# Patient Record
Sex: Male | Born: 1940 | Race: White | Hispanic: No | Marital: Married | Smoking: Current every day smoker
Health system: Southern US, Community
[De-identification: ages and names within clinical notes are randomized; demographics above are authoritative.]

## PROBLEM LIST (undated history)

## (undated) DIAGNOSIS — I25119 Atherosclerotic heart disease of native coronary artery with unspecified angina pectoris: Secondary | ICD-10-CM

## (undated) DIAGNOSIS — M545 Low back pain, unspecified: Secondary | ICD-10-CM

## (undated) DIAGNOSIS — C61 Malignant neoplasm of prostate: Secondary | ICD-10-CM

## (undated) DIAGNOSIS — C4492 Squamous cell carcinoma of skin, unspecified: Secondary | ICD-10-CM

## (undated) DIAGNOSIS — E039 Hypothyroidism, unspecified: Secondary | ICD-10-CM

## (undated) DIAGNOSIS — E785 Hyperlipidemia, unspecified: Secondary | ICD-10-CM

## (undated) DIAGNOSIS — F331 Major depressive disorder, recurrent, moderate: Secondary | ICD-10-CM

## (undated) DIAGNOSIS — D075 Carcinoma in situ of prostate: Secondary | ICD-10-CM

## (undated) HISTORY — DX: Hyperlipidemia, unspecified: E78.5

## (undated) HISTORY — DX: Squamous cell carcinoma of skin, unspecified: C44.92

## (undated) HISTORY — DX: Low back pain, unspecified: M54.50

## (undated) HISTORY — DX: Malignant neoplasm of prostate: C61

## (undated) HISTORY — DX: Hypothyroidism, unspecified: E03.9

## (undated) HISTORY — DX: Low back pain: M54.5

## (undated) HISTORY — DX: Major depressive disorder, recurrent, moderate: F33.1

## (undated) HISTORY — DX: Atherosclerotic heart disease of native coronary artery with unspecified angina pectoris: I25.119

## (undated) HISTORY — DX: Carcinoma in situ of prostate: D07.5

---

## 2018-06-04 ENCOUNTER — Encounter: Payer: Self-pay | Admitting: Pulmonary Disease

## 2018-06-04 LAB — PULMONARY FUNCTION TEST

## 2018-06-26 ENCOUNTER — Other Ambulatory Visit: Payer: Self-pay | Admitting: Family Medicine

## 2018-06-26 ENCOUNTER — Ambulatory Visit
Admission: RE | Admit: 2018-06-26 | Discharge: 2018-06-26 | Disposition: A | Discharge status: Home / Self Care | Payer: No Typology Code available for payment source | Source: Ambulatory Visit | Attending: Family Medicine | Admitting: Family Medicine

## 2018-06-26 DIAGNOSIS — R93422 Abnormal radiologic findings on diagnostic imaging of left kidney: Secondary | ICD-10-CM

## 2018-07-24 ENCOUNTER — Institutional Professional Consult (permissible substitution): Payer: No Typology Code available for payment source | Admitting: Pulmonary Disease

## 2018-08-06 ENCOUNTER — Encounter: Payer: Self-pay | Admitting: Pulmonary Disease

## 2018-08-09 ENCOUNTER — Encounter: Payer: Self-pay | Admitting: Pulmonary Disease

## 2018-08-09 ENCOUNTER — Ambulatory Visit (INDEPENDENT_AMBULATORY_CARE_PROVIDER_SITE_OTHER): Payer: No Typology Code available for payment source | Admitting: Pulmonary Disease

## 2018-08-09 VITALS — BP 116/72 | HR 69 | Ht 70.5 in | Wt 202.0 lb

## 2018-08-09 DIAGNOSIS — R062 Wheezing: Secondary | ICD-10-CM | POA: Diagnosis not present

## 2018-08-09 DIAGNOSIS — R0609 Other forms of dyspnea: Secondary | ICD-10-CM | POA: Diagnosis not present

## 2018-08-09 DIAGNOSIS — J440 Chronic obstructive pulmonary disease with acute lower respiratory infection: Secondary | ICD-10-CM

## 2018-08-09 DIAGNOSIS — J209 Acute bronchitis, unspecified: Secondary | ICD-10-CM

## 2018-08-09 MED ORDER — BENZONATATE 200 MG PO CAPS: 200.0000 mg | ORAL_CAPSULE | Freq: Three times a day (TID) | ORAL | 1 refills | Status: AC | PRN

## 2018-08-09 MED ORDER — BUDESONIDE-FORMOTEROL FUMARATE 160-4.5 MCG/ACT IN AERO: 2.0000 | INHALATION_SPRAY | Freq: Two times a day (BID) | RESPIRATORY_TRACT | 0 refills | Status: AC

## 2018-08-09 MED ORDER — BUDESONIDE-FORMOTEROL FUMARATE 160-4.5 MCG/ACT IN AERO: 2.0000 | INHALATION_SPRAY | Freq: Two times a day (BID) | RESPIRATORY_TRACT | 5 refills | Status: AC

## 2018-08-09 MED ORDER — PREDNISONE 10 MG PO TABS: ORAL_TABLET | ORAL | 0 refills | Status: DC

## 2018-08-09 NOTE — Patient Instructions (Signed)
COPD-asthma: We will check a complete blood count with differential and serum IgE to see if there is evidence of an allergy causing this Increase Symbicort to 160 dose, 2 puffs twice a day no matter how you feel Use Symbicort with a spacer Keep using albuterol as needed for chest tightness wheezing or shortness of breath Because you are having acute exacerbation of COPD and but I have you take prednisone 20 mg daily x5 days  Chest congestion/cough: I think this is due to lisinopril Stop taking lisinopril Use Tessalon as needed for cough  Shortness of breath: We will check your oxygen level while walking today  We will see you back in 2 to 3 weeks to review the records from the [place] lung function test and CT scan.  Nurse practitioner visit okay.

## 2018-08-09 NOTE — Progress Notes (Deleted)
   Subjective:    Patient ID: Brendan Yu, male    DOB: [birth year], 77 y.o.   MRN: [id number]  HPI    Review of Systems  Constitutional: Negative for fever and unexpected weight change.  HENT: Negative for congestion, dental problem, ear pain, nosebleeds, postnasal drip, rhinorrhea, sinus pressure, sneezing, sore throat and trouble swallowing.   Eyes: Negative for redness and itching.  Respiratory: Positive for cough, chest tightness and shortness of breath. Negative for wheezing.   Cardiovascular: Negative for palpitations and leg swelling.  Gastrointestinal: Negative for nausea and vomiting.  Genitourinary: Negative for dysuria.  Musculoskeletal: Negative for joint swelling.  Skin: Negative for rash.  Allergic/Immunologic: Negative.  Negative for environmental allergies, food allergies and immunocompromised state.  Neurological: Negative for headaches.  Hematological: Does not bruise/bleed easily.  Psychiatric/Behavioral: Negative for dysphoric mood. The patient is not nervous/anxious.        Objective:   Physical Exam        Assessment & Plan:

## 2018-08-09 NOTE — Progress Notes (Signed)
Synopsis: Referred in Aug 2019 for COPD  Subjective:   PATIENT ID: Brendan Yu GENDER: male DOB: 05-Sep-1941, MRN: [id number]   HPI  Chief Complaint  Patient presents with  . Pulm Consult    Referred by the [facility] for COPD. Patient states he was diagnosed with bronchitis. He has a non-productive cough, SOB and chest tightness.    This is a pleasant 77 y/o male with a history of coronary artery disease who comes to my clinic today for evaluation of ongoing wheezing, chest congestion and shortness of breath.  He says that he has smoked 1-1/2 packs of cigarettes daily since his teenage years but quit in 2019.  He had no childhood asthma.  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  He also had agent orange exposure when he was in [place].  He says that for most of his adult life he never had any breathing difficulty until April of this year.  While visiting [place] he had a fever and severe shortness of breath and was admitted to the hospital overnight.  He was treated with antibiotics and steroids and discharged home.  Since then he has been having cough with tan mucus production, wheezing on exertion, and shortness of breath with minimal exertion.  He says that he feels a rattling in his chest from time to time.  He has the need to clear his throat frequently.  Cough is intermittent, does not happen all the time.  He has been taking Symbicort which helps a little bit.  He takes albuterol as well which was newly prescribed and he says that he does not work right away but it will help his breathing from time to time.  He uses a spacer for the pro-air but he does not use it for the Symbicort.  He stopped taking the Symbicort at one point and his wheezing came back and got worse.  In the last 2 to 3 days chest congestion and dyspnea have worsened.  He says that he has heartburn from time to time.  He denies sinus symptoms.  No recent changes in  his living environment apart from the fact that he moved here 2 years ago.  He is in the process of moving to a townhouse because it simpler for he and his wife.  His wife is disabled and he has to care for her.  He says that whenever he works in the yard with grass it makes him short of breath and congested.\  He takes over-the-counter ranitidine from time to time    Past Medical History:  Diagnosis Date  . Atherosclerotic heart disease of native coronary artery with unspecified angina pectoris ([place])   . Carcinoma in situ of prostate   . Hyperlipidemia   . Hypothyroidism   . Low back pain   . Major depressive disorder, recurrent episode, moderate ([place])   . Malignant neoplasm of prostate ([place])   . Squamous cell carcinoma of skin      No family history on file.   Social History   Socioeconomic History  . Marital status: Married    Spouse name: Not on file  . Number of children: Not on file  . Years of education: Not on file  . Highest education level: Not on file  Occupational History  . Not on file  Social Needs  . Financial resource strain: Not on file  . Food insecurity:  Worry: Not on file    Inability: Not on file  . Transportation needs:    Medical: Not on file    Non-medical: Not on file  Tobacco Use  . Smoking status: Former Smoker    Last attempt to quit: 12/13/2007    Years since quitting: 10.6  . Smokeless tobacco: Former Network engineer and Sexual Activity  . Alcohol use: Not on file  . Drug use: Not on file  . Sexual activity: Not on file  Lifestyle  . Physical activity:    Days per week: Not on file    Minutes per session: Not on file  . Stress: Not on file  Relationships  . Social connections:    Talks on phone: Not on file    Gets together: Not on file    Attends religious service: Not on file    Active member of club or organization: Not on file    Attends meetings of clubs or organizations: Not on file    Relationship status: Not on file  .  Intimate partner violence:    Fear of current or ex partner: Not on file    Emotionally abused: Not on file    Physically abused: Not on file    Forced sexual activity: Not on file  Other Topics Concern  . Not on file  Social History Narrative  . Not on file     No Known Allergies   Outpatient Medications Prior to Visit  Medication Sig Dispense Refill  . albuterol (PROVENTIL HFA;VENTOLIN HFA) 108 (90 Base) MCG/ACT inhaler Inhale 2 puffs into the lungs every 6 (six) hours as needed.    Marland Kitchen albuterol (PROVENTIL) (2.5 MG/3ML) 0.083% nebulizer solution Take 2.5 mg by nebulization every 6 (six) hours as needed.    Marland Kitchen ascorbic acid (VITAMIN C) 500 MG tablet Take 500 mg by mouth daily.    Marland Kitchen aspirin EC 81 MG tablet Take 81 mg by mouth daily.    Marland Kitchen atorvastatin (LIPITOR) 80 MG tablet Take 80 mg by mouth daily.    . citalopram (CELEXA) 40 MG tablet Take 40 mg by mouth daily.    Marland Kitchen levothyroxine (SYNTHROID, LEVOTHROID) 75 MCG tablet Take 75 mcg by mouth daily before breakfast.    . lisinopril (PRINIVIL,ZESTRIL) 10 MG tablet Take 10 mg by mouth daily.    Marland Kitchen losartan (COZAAR) 100 MG tablet Take 100 mg by mouth daily.    . meloxicam (MOBIC) 7.5 MG tablet Take 7.5 mg by mouth daily. Take 1 tablet by mouth daily for hip and back pain.    . budesonide-formoterol (SYMBICORT) 80-4.5 MCG/ACT inhaler Inhale 2 puffs into the lungs 2 (two) times daily.    Marland Kitchen escitalopram (LEXAPRO) 20 MG tablet Take 20 mg by mouth daily.     No facility-administered medications prior to visit.     Review of Systems  Constitutional: Negative for chills, fever, malaise/fatigue and weight loss.  HENT: Negative for congestion, nosebleeds, sinus pain and sore throat.   Eyes: Negative for photophobia, pain and discharge.  Respiratory: Positive for cough, sputum production, shortness of breath and wheezing. Negative for hemoptysis.   Cardiovascular: Negative for chest pain, palpitations, orthopnea and leg swelling.  Gastrointestinal:  Negative for abdominal pain, constipation, diarrhea, nausea and vomiting.  Genitourinary: Negative for dysuria, frequency, hematuria and urgency.  Musculoskeletal: Negative for back pain, joint pain, myalgias and neck pain.  Skin: Negative for itching and rash.  Neurological: Negative for tingling, tremors, sensory change, speech change, focal weakness,  seizures, weakness and headaches.  Psychiatric/Behavioral: Negative for memory loss, substance abuse and suicidal ideas. The patient is not nervous/anxious.       Objective:  Physical Exam   Vitals:   08/09/18 1530  BP: 116/72  Pulse: 69  SpO2: 95%  Weight: 202 lb (91.6 kg)  Height: 5' 10.5" (1.791 m)    Gen: well appearing, no acute distress HENT: NCAT, OP clear, neck supple without masses Eyes: PERRL, EOMi Lymph: no cervical lymphadenopathy PULM: Wheezing bilaterally CV: RRR, no mgr, no JVD GI: BS+, soft, nontender, no hsm Derm: no rash or skin breakdown MSK: normal bulk and tone Neuro: A&Ox4, CN II-XII intact, strength 5/5 in all 4 extremities Psyche: normal mood and affect   CBC No results found for: WBC, RBC, HGB, HCT, PLT, MCV, MCH, MCHC, RDW, LYMPHSABS, MONOABS, EOSABS, BASOSABS   Chest imaging:  PFT:  Labs:  Path:  Echo:  Heart Catheterization:  Records reviewed from his visits with the [facility].  There he has a documented history of hypothyroidism, hyperlipidemia, prostate carcinoma, coronary artery disease, major depression, and squamous cell carcinoma of the skin.  He is being treated with Symbicort he also takes lisinopril daily.  He has been referred to me for COPD it is believed that he had a recent COPD exacerbation according to their records he had lung function testing.  Results of the lung function testing have not been sent.  There is also mention of a CT scan.  Again, I do not have those results either.     Assessment & Plan:   Wheezing - Plan: CBC with Differential/Platelet, IgE  Acute  bronchitis with COPD ([place])  Dyspnea on exertion  Discussion: This is a pleasant 77 year old male with a lengthy smoking history who has a history consistent with COPD.  Unfortunately I do not have the records of his recent lung function test or CT scan to confirm this diagnosis and will need to request it.  I am going to operate under the assumption that he has a COPD asthma overlap syndrome though because he has significant wheezing on physical exam which is been intermittent over the last few months.  Plan: COPD-asthma: We will check a complete blood count with differential and serum IgE to see if there is evidence of an allergy causing this Increase Symbicort to 160 dose, 2 puffs twice a day no matter how you feel Use Symbicort with a spacer Keep using albuterol as needed for chest tightness wheezing or shortness of breath Because you are having acute exacerbation of COPD and but I have you take prednisone 20 mg daily x5 days  Chest congestion/cough: I think this is due to lisinopril Stop taking lisinopril Use Tessalon as needed for cough  Shortness of breath: We will check your oxygen level while walking today  We will see you back in 2 to 3 weeks to review the records from the [place] lung function test and CT scan.  Nurse practitioner visit okay.    Current Outpatient Medications:  .  albuterol (PROVENTIL HFA;VENTOLIN HFA) 108 (90 Base) MCG/ACT inhaler, Inhale 2 puffs into the lungs every 6 (six) hours as needed., Disp: , Rfl:  .  albuterol (PROVENTIL) (2.5 MG/3ML) 0.083% nebulizer solution, Take 2.5 mg by nebulization every 6 (six) hours as needed., Disp: , Rfl:  .  ascorbic acid (VITAMIN C) 500 MG tablet, Take 500 mg by mouth daily., Disp: , Rfl:  .  aspirin EC 81 MG tablet, Take 81 mg by mouth daily.,  Disp: , Rfl:  .  atorvastatin (LIPITOR) 80 MG tablet, Take 80 mg by mouth daily., Disp: , Rfl:  .  citalopram (CELEXA) 40 MG tablet, Take 40 mg by mouth daily., Disp: , Rfl:  .   levothyroxine (SYNTHROID, LEVOTHROID) 75 MCG tablet, Take 75 mcg by mouth daily before breakfast., Disp: , Rfl:  .  lisinopril (PRINIVIL,ZESTRIL) 10 MG tablet, Take 10 mg by mouth daily., Disp: , Rfl:  .  losartan (COZAAR) 100 MG tablet, Take 100 mg by mouth daily., Disp: , Rfl:  .  meloxicam (MOBIC) 7.5 MG tablet, Take 7.5 mg by mouth daily. Take 1 tablet by mouth daily for hip and back pain., Disp: , Rfl:  .  benzonatate (TESSALON) 200 MG capsule, Take 1 capsule (200 mg total) by mouth 3 (three) times daily as needed for cough., Disp: 45 capsule, Rfl: 1 .  budesonide-formoterol (SYMBICORT) 160-4.5 MCG/ACT inhaler, Inhale 2 puffs into the lungs 2 (two) times daily., Disp: 1 Inhaler, Rfl: 5 .  budesonide-formoterol (SYMBICORT) 160-4.5 MCG/ACT inhaler, Inhale 2 puffs into the lungs 2 (two) times daily., Disp: 1 Inhaler, Rfl: 0 .  predniSONE (DELTASONE) 10 MG tablet, Take 2 tablets daily for 5 days., Disp: 10 tablet, Rfl: 0

## 2018-08-12 LAB — CBC WITH DIFFERENTIAL/PLATELET
BASOS: 1 %
Basophils Absolute: 0.1 10*3/uL (ref 0.0–0.2)
EOS (ABSOLUTE): 0.8 10*3/uL — ABNORMAL HIGH (ref 0.0–0.4)
EOS: 9 %
HEMATOCRIT: 42.8 % (ref 37.5–51.0)
Hemoglobin: 14.3 g/dL (ref 13.0–17.7)
Immature Grans (Abs): 0 10*3/uL (ref 0.0–0.1)
Immature Granulocytes: 0 %
LYMPHS ABS: 2.1 10*3/uL (ref 0.7–3.1)
Lymphs: 25 %
MCH: 31.3 pg (ref 26.6–33.0)
MCHC: 33.4 g/dL (ref 31.5–35.7)
MCV: 94 fL (ref 79–97)
MONOS ABS: 0.7 10*3/uL (ref 0.1–0.9)
Monocytes: 8 %
Neutrophils Absolute: 4.8 10*3/uL (ref 1.4–7.0)
Neutrophils: 57 %
Platelets: 295 10*3/uL (ref 150–450)
RBC: 4.57 x10E6/uL (ref 4.14–5.80)
RDW: 14.2 % (ref 12.3–15.4)
WBC: 8.5 10*3/uL (ref 3.4–10.8)

## 2018-08-12 LAB — IGE: IgE (Immunoglobulin E), Serum: 58 IU/mL (ref 6–495)

## 2018-08-14 ENCOUNTER — Other Ambulatory Visit: Payer: Self-pay | Admitting: Pulmonary Disease

## 2018-08-14 DIAGNOSIS — Z789 Other specified health status: Secondary | ICD-10-CM

## 2018-08-15 ENCOUNTER — Telehealth: Payer: Self-pay | Admitting: Pulmonary Disease

## 2018-08-15 NOTE — Telephone Encounter (Signed)
Nothing further needed at this time. 

## 2018-08-16 ENCOUNTER — Other Ambulatory Visit: Payer: Self-pay | Admitting: Family Medicine

## 2018-08-20 NOTE — Progress Notes (Signed)
Record review from the [facility]: 05/2018 CT chest : "Evidence for infectious or inflammatory bronchitis", scattered small indeterminate pulmonary nodules follow-up CT suggested in 12 months.  Pronounced soft tissue contour anterior upper pole left kidney, likely within normal limits but follow-up ultrasound was suggested  June 2019 pulmonary function testing: Ratio 46%, FEV1 1.46 L 51% predicted, improved to 2.12 L 74% predicted, 45% change, total lung capacity 7.51 L 114% predicted, residual volume 138% predicted, DLCO 18.9 mL 84% predicted

## 2018-08-23 ENCOUNTER — Telehealth: Payer: Self-pay | Admitting: Pulmonary Disease

## 2018-08-23 DIAGNOSIS — Z789 Other specified health status: Secondary | ICD-10-CM

## 2018-08-23 NOTE — Telephone Encounter (Signed)
ATC patient but he did not answer. VM was full at the time of call. Will attempt to call back later.

## 2018-08-24 NOTE — Telephone Encounter (Signed)
Pt is calling back [phone]

## 2018-08-24 NOTE — Telephone Encounter (Signed)
Called pt and he was unavailable to come to the phone. I advised the person who answered the phone to have pt call us back.

## 2018-08-24 NOTE — Telephone Encounter (Signed)
Called and spoke to patient. Patient stated that he called the [place] but that they need [place] to send referral not just for him to call saying he needs to see specialist.   [facility]   Nothing further needed at this time.

## 2018-09-05 ENCOUNTER — Ambulatory Visit: Payer: Self-pay | Admitting: Allergy and Immunology

## 2018-09-06 ENCOUNTER — Ambulatory Visit (INDEPENDENT_AMBULATORY_CARE_PROVIDER_SITE_OTHER): Payer: No Typology Code available for payment source | Admitting: Pulmonary Disease

## 2018-09-06 ENCOUNTER — Encounter: Payer: Self-pay | Admitting: Pulmonary Disease

## 2018-09-06 VITALS — BP 114/64 | HR 65 | Ht 70.0 in | Wt 200.0 lb

## 2018-09-06 DIAGNOSIS — Z23 Encounter for immunization: Secondary | ICD-10-CM

## 2018-09-06 DIAGNOSIS — R918 Other nonspecific abnormal finding of lung field: Secondary | ICD-10-CM

## 2018-09-06 DIAGNOSIS — J449 Chronic obstructive pulmonary disease, unspecified: Secondary | ICD-10-CM | POA: Diagnosis not present

## 2018-09-06 NOTE — Patient Instructions (Signed)
Allergy: Blood work on the last visit showed that she may be allergic to something, though the testing was nonspecific Because you are doing better we will forego further work-up for this right now However, if you have worsening symptoms of chest congestion or mucus production let us know and we can investigate further with more allergy testing  COPD-overlap syndrome: Keep taking Symbicort as you are doing However, if you have worsening shortness of breath take 2 puffs twice a day no matter how you feel Use albuterol as needed for chest tightness wheezing or shortness of breath High-dose flu shot today Practice good hand hygiene Stay active  Pulmonary nodules: Nodules were seen on the CT scan of your chest from the [place] in June 2019 We will repeat a CT scan of your chest in June 2020 to make sure these nodules are not increasing in size  I will plan on seeing you back in June 2020 or sooner if needed

## 2018-09-06 NOTE — Progress Notes (Signed)
Synopsis: Referred in Aug 2019 for COPD- asthma overlap syndrome. Significantly improved when we stopped lisinopril in 07/2018.   Subjective:   PATIENT ID: Brendan Yu GENDER: male DOB: 1941/07/16, MRN: [id number]   HPI  Chief Complaint  Patient presents with  . Follow-up    Pt is doing well overall, no wheezing since last ov. Pt is requesting flu shot today.    Brendan Yu says that he has been doing well since the last visit.  Specifically he says that his breathing has improved and is now able to do more activities in his health such as climbing up and down ladders without feeling shortness of breath.  He no longer wheezes.  He says that he does have a little bit of cough in the mornings with some mucus production but is not as severe as it was when I saw him previously.  He still only taking Symbicort on an as-needed basis, typically 2 puffs in the morning.  He does not feel that he needs it later in the day so he does not use it.  He has not had any problems with sinus congestion or mucus production.   Past Medical History:  Diagnosis Date  . Atherosclerotic heart disease of native coronary artery with unspecified angina pectoris ([place])   . Carcinoma in situ of prostate   . Hyperlipidemia   . Hypothyroidism   . Low back pain   . Major depressive disorder, recurrent episode, moderate ([place])   . Malignant neoplasm of prostate ([place])   . Squamous cell carcinoma of skin      Review of Systems  Constitutional: Negative for chills, fever, malaise/fatigue and weight loss.  HENT: Negative for congestion, nosebleeds, sinus pain and sore throat.   Eyes: Negative for photophobia, pain and discharge.  Respiratory: Positive for cough, sputum production, shortness of breath and wheezing. Negative for hemoptysis.   Cardiovascular: Negative for chest pain, palpitations, orthopnea and leg swelling.  Gastrointestinal: Negative for abdominal pain, constipation, diarrhea, nausea and vomiting.    Genitourinary: Negative for dysuria, frequency, hematuria and urgency.  Musculoskeletal: Negative for back pain, joint pain, myalgias and neck pain.  Skin: Negative for itching and rash.  Neurological: Negative for tingling, tremors, sensory change, speech change, focal weakness, seizures, weakness and headaches.  Psychiatric/Behavioral: Negative for memory loss, substance abuse and suicidal ideas. The patient is not nervous/anxious.       Objective:  Physical Exam   Vitals:   09/06/18 1353  BP: 114/64  Pulse: 65  SpO2: 95%  Weight: 200 lb (90.7 kg)  Height: 5\' 10"  (1.778 m)    Gen: well appearing HENT: OP clear, TM's clear, neck supple PULM: CTA B, normal percussion CV: RRR, no mgr, trace edema GI: BS+, soft, nontender Derm: no cyanosis or rash Psyche: normal mood and affect    CBC    Component Value Date/Time   WBC 8.5 08/09/2018 1640   RBC 4.57 08/09/2018 1640   HGB 14.3 08/09/2018 1640   HCT 42.8 08/09/2018 1640   PLT 295 08/09/2018 1640   MCV 94 08/09/2018 1640   MCH 31.3 08/09/2018 1640   MCHC 33.4 08/09/2018 1640   RDW 14.2 08/09/2018 1640   LYMPHSABS 2.1 08/09/2018 1640   EOSABS 0.8 (H) 08/09/2018 1640   BASOSABS 0.1 08/09/2018 1640     Chest imaging: 05/2018 CT chest : "Evidence for infectious or inflammatory bronchitis", scattered small indeterminate pulmonary nodules follow-up CT suggested in 12 months.  Pronounced soft tissue contour anterior upper  pole left kidney, likely within normal limits but follow-up ultrasound was suggested  PFT: June 2019 pulmonary function testing: Ratio 46%, FEV1 1.46 L 51% predicted, improved to 2.12 L 74% predicted, 45% change, total lung capacity 7.51 L 114% predicted, residual volume 138% predicted, DLCO 18.9 mL 84% predicted  Labs: 07/2018 Eosinophils: 800 cell/uL, IgE 58 (normal)  Path:  [place]:  Heart Catheterization:  Records reviewed from his visits with the [facility].  There he has a documented history of  hypothyroidism, hyperlipidemia, prostate carcinoma, coronary artery disease, major depression, and squamous cell carcinoma of the skin.  He is being treated with Symbicort he also takes lisinopril daily.  He has been referred to me for COPD it is believed that he had a recent COPD exacerbation according to their records he had lung function testing.  Results of the lung function testing have not been sent.  There is also mention of a CT scan.  Again, I do not have those results either.      Assessment & Plan:   Abnormal findings on diagnostic imaging of lung - Plan: CT Chest Wo Contrast  COPD with asthma ([place])  Multiple pulmonary nodules  Encounter for immunization - Plan: Flu vaccine HIGH DOSE PF  Discussion: Brendan Yu is doing much better since we stopped lisinopril.  I believe that this was the predominant cause of his symptoms.  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  I explained to him today that if he has increasing shortness of breath he should start using Symbicort 2 puffs twice a day.  If he still has problems then we may need to investigate whether or not we should consider more aggressive treatment for a likely allergy as his serum eosinophil count is elevated.  Plan: Allergy: Blood work on the last visit showed that she may be allergic to something, though the testing was nonspecific Because you are doing better we will forego further work-up for this right now However, if you have worsening symptoms of chest congestion or mucus production let us know and we can investigate further with more allergy testing  COPD-overlap syndrome: Keep taking Symbicort as you are doing However, if you have worsening shortness of breath take 2 puffs twice a day no matter how you feel Use albuterol as needed for chest tightness wheezing or shortness of breath High-dose flu shot today Practice good  hand hygiene Stay active  Pulmonary nodules: Nodules were seen on the CT scan of your chest from the [place] in June 2019 We will repeat a CT scan of your chest in June 2020 to make sure these nodules are not increasing in size  I will plan on seeing you back in June 2020 or sooner if needed    Current Outpatient Medications:  .  albuterol (PROVENTIL HFA;VENTOLIN HFA) 108 (90 Base) MCG/ACT inhaler, Inhale 2 puffs into the lungs every 6 (six) hours as needed., Disp: , Rfl:  .  albuterol (PROVENTIL) (2.5 MG/3ML) 0.083% nebulizer solution, Take 2.5 mg by nebulization every 6 (six) hours as needed., Disp: , Rfl:  .  ascorbic acid (VITAMIN C) 500 MG tablet, Take 500 mg by mouth daily., Disp: , Rfl:  .  aspirin EC 81 MG tablet, Take 81 mg by mouth daily., Disp: , Rfl:  .  atorvastatin (LIPITOR) 80 MG tablet, Take 80 mg by mouth daily., Disp: , Rfl:  .  budesonide-formoterol (SYMBICORT) 160-4.5 MCG/ACT  inhaler, Inhale 2 puffs into the lungs 2 (two) times daily., Disp: 1 Inhaler, Rfl: 5 .  budesonide-formoterol (SYMBICORT) 160-4.5 MCG/ACT inhaler, Inhale 2 puffs into the lungs 2 (two) times daily., Disp: 1 Inhaler, Rfl: 0 .  citalopram (CELEXA) 40 MG tablet, Take 40 mg by mouth daily., Disp: , Rfl:  .  levothyroxine (SYNTHROID, LEVOTHROID) 75 MCG tablet, Take 75 mcg by mouth daily before breakfast., Disp: , Rfl:  .  lisinopril (PRINIVIL,ZESTRIL) 10 MG tablet, Take 10 mg by mouth daily., Disp: , Rfl:  .  losartan (COZAAR) 100 MG tablet, Take 100 mg by mouth daily., Disp: , Rfl:  .  meloxicam (MOBIC) 7.5 MG tablet, Take 7.5 mg by mouth daily. Take 1 tablet by mouth daily for hip and back pain., Disp: , Rfl:  .  benzonatate (TESSALON) 200 MG capsule, Take 1 capsule (200 mg total) by mouth 3 (three) times daily as needed for cough. (Patient not taking: Reported on 09/06/2018), Disp: 45 capsule, Rfl: 1

## 2020-06-20 IMAGING — US US RENAL
1 series · 14 of 25 positions shown · non-contrast
Comparison: None.

CLINICAL DATA: Abnormal radiologic findings on diagnostic imaging
of the left kidney. Abnormal CT scan. Report not provided.

EXAM:
RENAL / URINARY TRACT ULTRASOUND COMPLETE

[Series 1: us renal · 0.30mm/px · 14 of 37 slices shown]
[im 1/37]
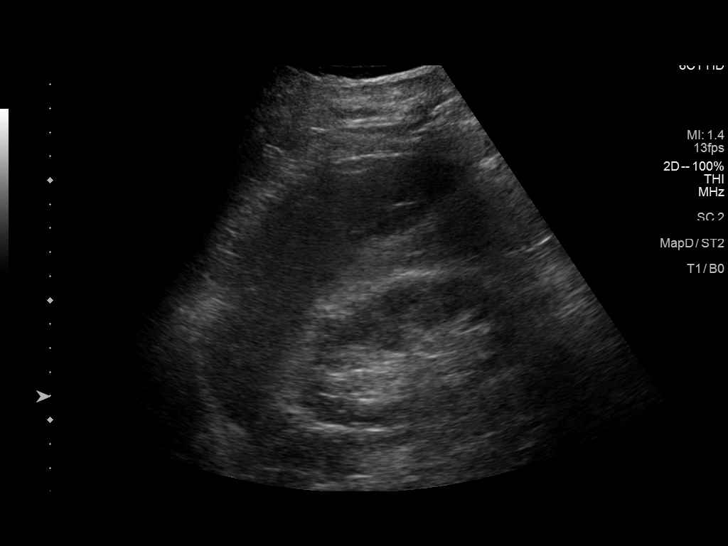
[im 4/37]
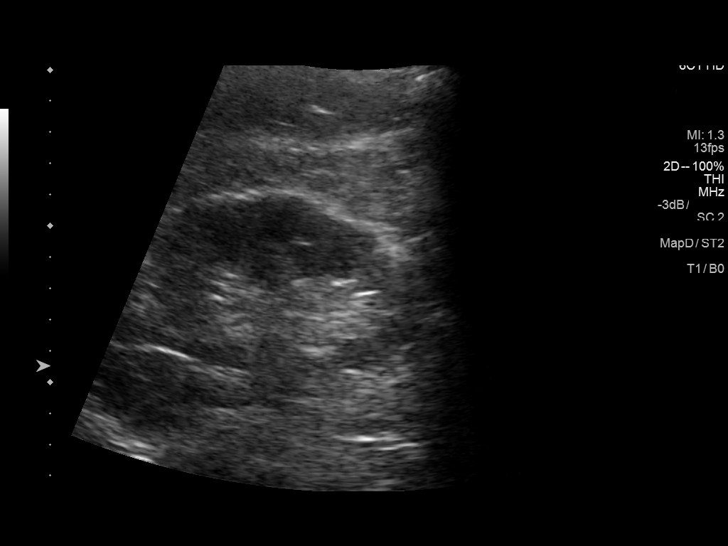
[im 7/37]
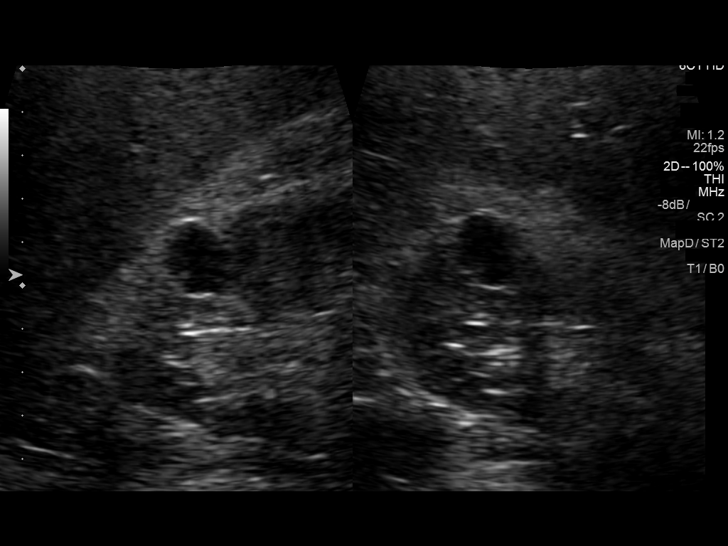
[im 10/37]
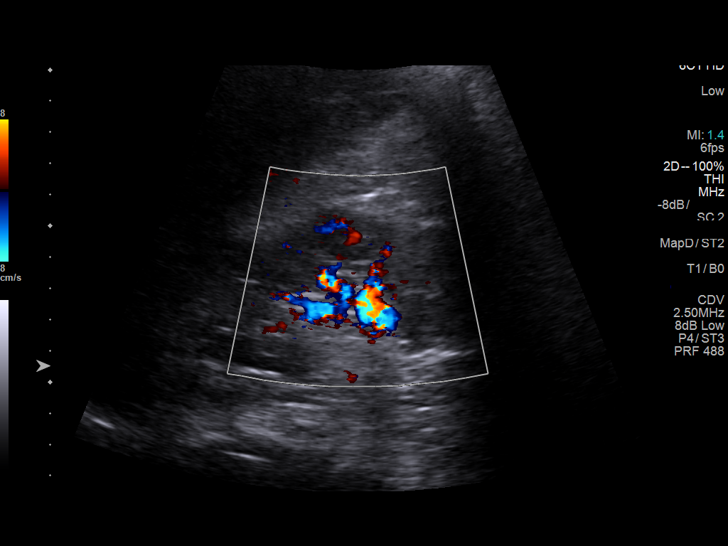
[im 13/37]
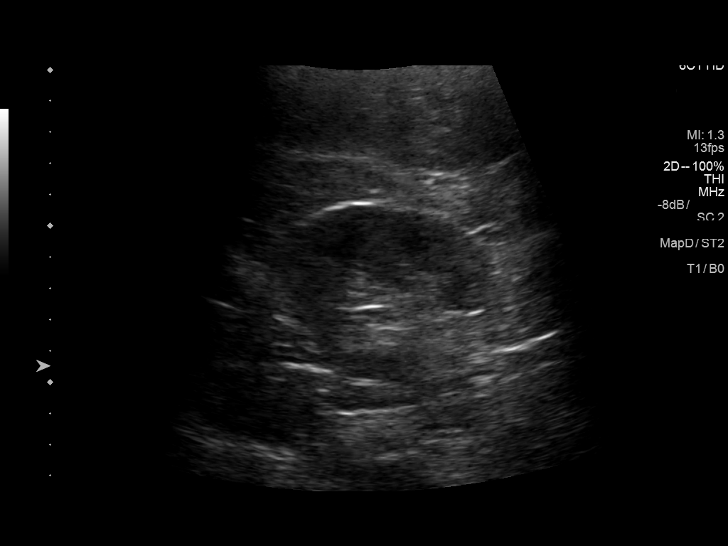
[im 14/37]
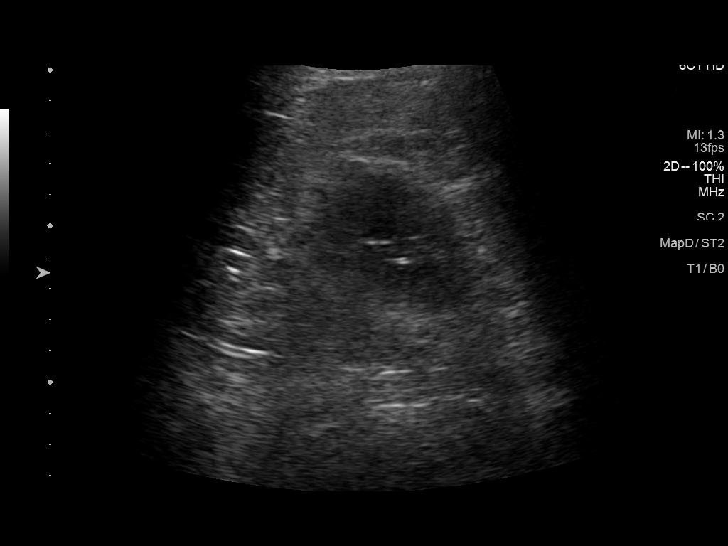
[im 17/37]
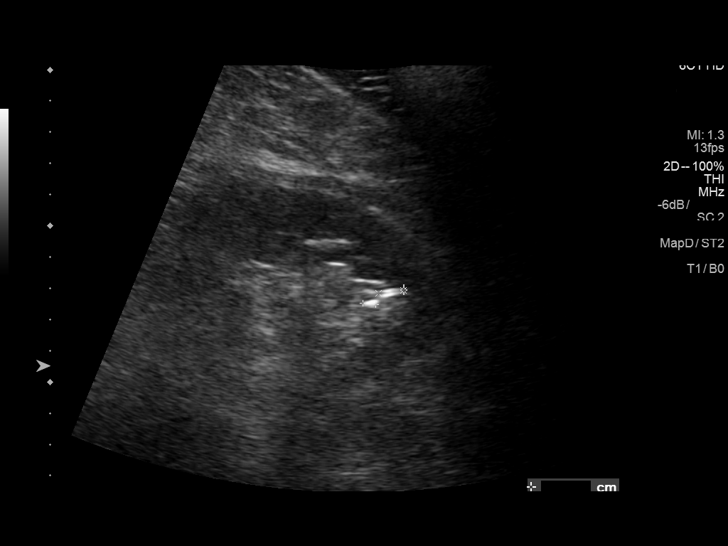
[im 20/37]
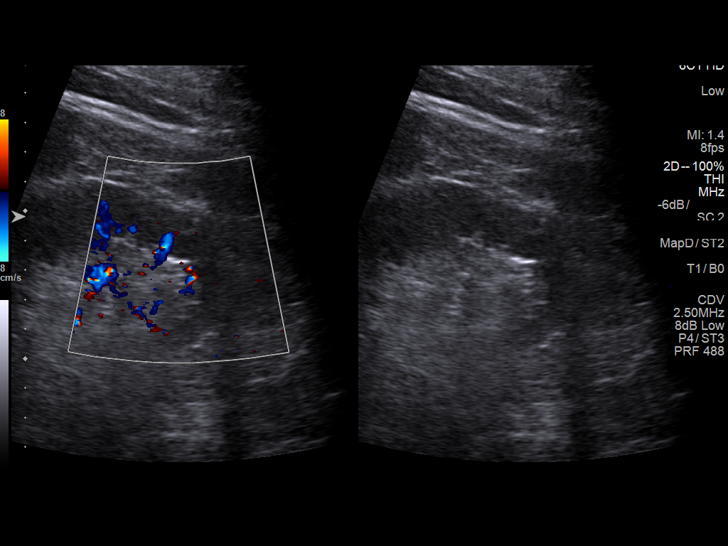
[im 23/37]
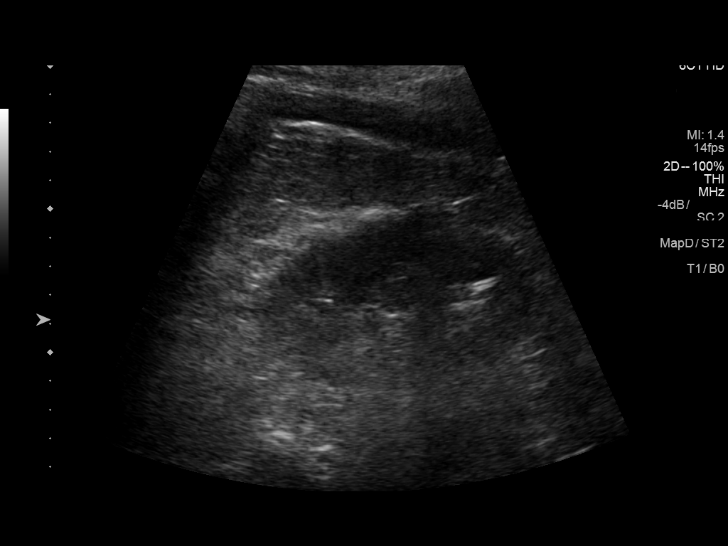
[im 25/37]
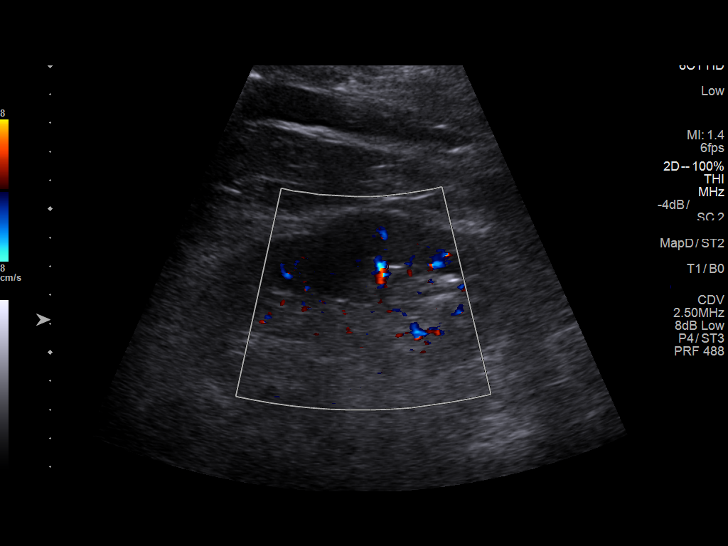
[im 28/37]
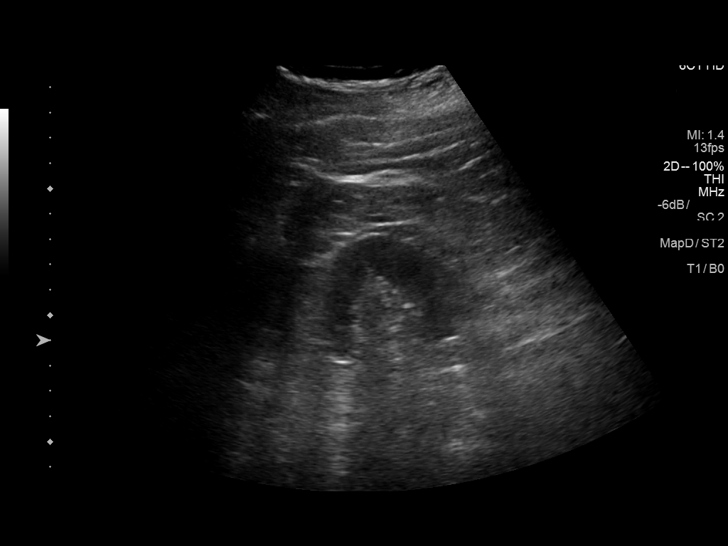
[im 31/37]
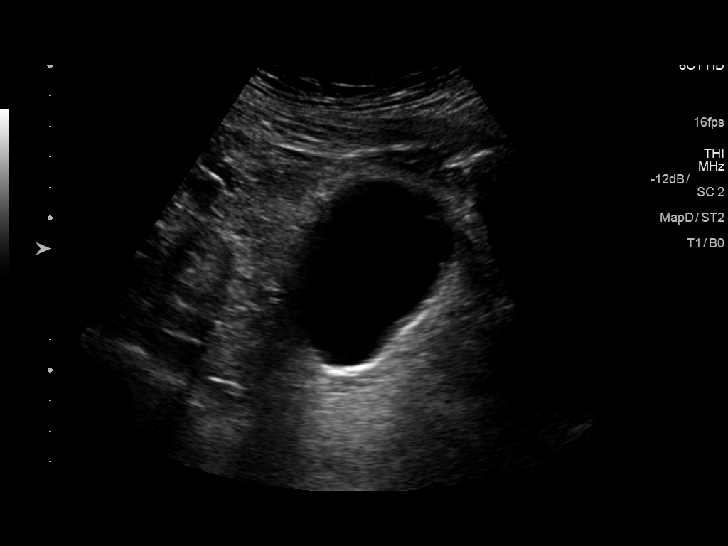
[im 34/37]
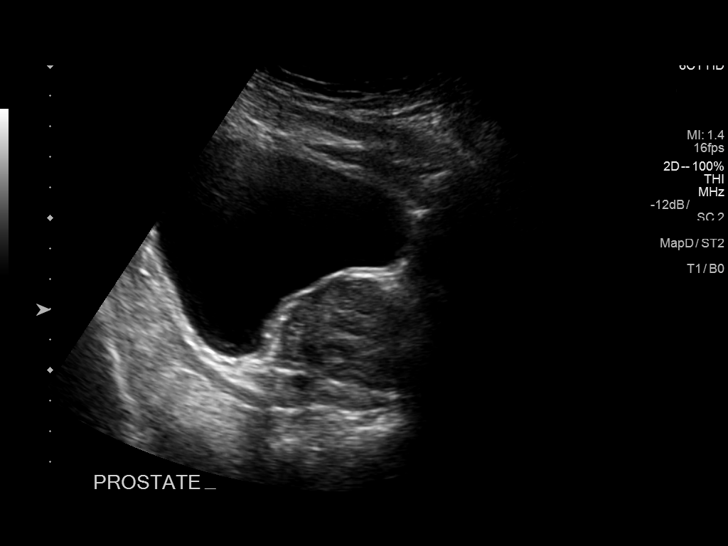
[im 37/37]
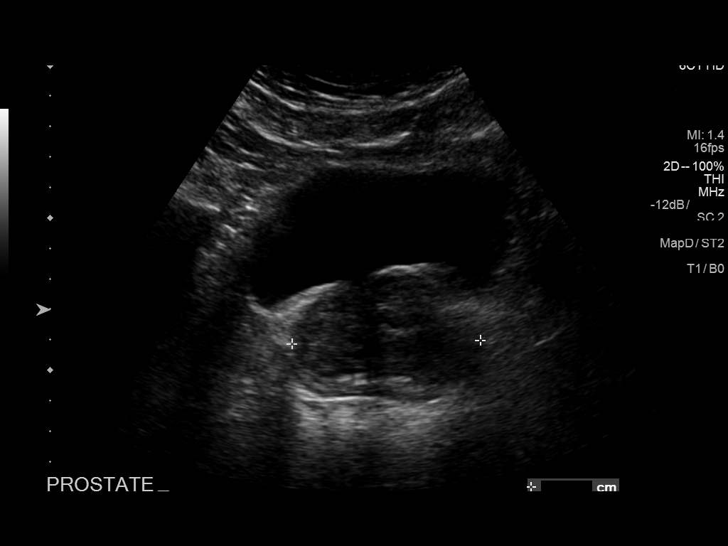

[14 of 25 positions shown; findings below may reference images not displayed]

FINDINGS: Right Kidney:

Length: 10.4 cm, within normal limits. Echogenicity within normal
limits. A simple cyst at the upper pole of the right kidney measures
1.4 x 1.7 x 1.5 cm. No other mass or hydronephrosis visualized.

Left Kidney:

Length: 11.4 cm, within normal limits. Echogenicity within normal
limits. Two radiopaque shadowing stones are present at the lower
pole of the left kidney. The largest measures 8.0 mm. There is no
associated hydronephrosis. No other mass lesion is present..

Bladder:

Appears normal for degree of bladder distention. The prostate gland
is homogeneously enlarged, measuring up to 6.2 cm in transverse
diameter.
IMPRESSION: 1. At least 2 nonobstructing stones at the lower pole of the left
kidney.
2. No other focal mass lesion within the left kidney.
3. Simple cyst at the upper pole of the right kidney measures
cm.
4. Enlarged prostate gland.
